# Patient Record
Sex: Female | Born: 2000 | Race: White | Hispanic: No | Marital: Single | Smoking: Never smoker
Health system: Southern US, Community
[De-identification: ages and names within clinical notes are randomized; demographics above are authoritative.]

## PROBLEM LIST (undated history)

## (undated) HISTORY — PX: WISDOM TOOTH EXTRACTION: SHX21

---
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
## 2022-01-04 ENCOUNTER — Encounter: Payer: Self-pay | Admitting: Emergency Medicine

## 2022-01-04 ENCOUNTER — Ambulatory Visit
Admission: EM | Admit: 2022-01-04 | Discharge: 2022-01-04 | Disposition: A | Discharge status: Home / Self Care | Payer: BC Managed Care – PPO | Attending: Physician Assistant | Admitting: Physician Assistant

## 2022-01-04 DIAGNOSIS — S61211A Laceration without foreign body of left index finger without damage to nail, initial encounter: Secondary | ICD-10-CM

## 2022-01-04 NOTE — Discharge Instructions (Signed)
-  Do not get the skin adhesive wet or it can interfere with the integrity and cause breakdown quicker.  Should come off on its own in 5 to 7 days. - I would keep a Band-Aid over it especially while you are working. - Return if you develop increased swelling, redness or increased pain or any signs of infection.  This will probably take about a week to heal.

## 2022-01-04 NOTE — ED Triage Notes (Addendum)
Pt has laceration on her left index finger.  She cut her finger with hair shears while cutting. Occurred yesterday. Pt states she is UTD on her tetanus.

## 2022-01-04 NOTE — ED Provider Notes (Signed)
[facility]    CSN: [id number] Arrival date & time: 01/04/22  1102      History   Chief Complaint Chief Complaint  Patient presents with   Laceration    HPI Sherry Franco is a 21 y.o. female presenting for laceration of the left index finger that occurred yesterday while cutting hair. She has cleaned the finger but says it is still bleeding at times. Up to date with Tdap.  No reports of weakness or difficulty with range of motion of the finger.  HPI  History reviewed. No pertinent past medical history.  There are no problems to display for this patient.   Past Surgical History:  Procedure Laterality Date   WISDOM TOOTH EXTRACTION      OB History   No obstetric history on file.      Home Medications    Prior to Admission medications   Medication Sig Start Date End Date Taking? Authorizing Provider  meloxicam (MOBIC) 7.5 MG tablet Take 7.5 mg by mouth daily.    [provider]    Family History No family history on file.  Social History Social History   Tobacco Use   Smoking status: Never   Smokeless tobacco: Never  Vaping Use   Vaping Use: Never used  Substance Use Topics   Alcohol use: Never   Drug use: Never     Allergies   Patient has no known allergies.   Review of Systems Review of Systems  Musculoskeletal:  Negative for arthralgias and joint swelling.  Skin:  Positive for wound. Negative for color change.  Neurological:  Negative for weakness and numbness.     Physical Exam Triage Vital Signs ED Triage Vitals  Enc Vitals Group     BP 01/04/22 1158 123/68     Pulse Rate 01/04/22 1158 79     Resp 01/04/22 1158 16     Temp 01/04/22 1158 98.3 F (36.8 C)     Temp Source 01/04/22 1158 Oral     SpO2 01/04/22 1158 96 %     Weight 01/04/22 1156 138 lb (62.6 kg)     Height 01/04/22 1156 5\' 2"  (1.575 m)     Head Circumference --      Peak Flow --      Pain Score 01/04/22 1154 4     Pain Loc --      Pain Edu?  --      Excl. in GC? --    No data found.  Updated Vital Signs BP 123/68 (BP Location: Right Arm)   Pulse 79   Temp 98.3 F (36.8 C) (Oral)   Resp 16   Ht 5\' 2"  (1.575 m)   Wt 138 lb (62.6 kg)   LMP 12/20/2021 (Approximate)   SpO2 96%   BMI 25.24 kg/m    Physical Exam Vitals and nursing note reviewed.  Constitutional:      General: She is not in acute distress.    Appearance: Normal appearance. She is not ill-appearing or toxic-appearing.  HENT:     Head: Normocephalic and atraumatic.  Eyes:     General: No scleral icterus.       Right eye: No discharge.        Left eye: No discharge.     Conjunctiva/sclera: Conjunctivae normal.  Cardiovascular:     Rate and Rhythm: Normal rate.     Pulses: Normal pulses.  Pulmonary:     Effort: Pulmonary effort is normal. No respiratory distress.  Musculoskeletal:     Cervical back: Neck supple.  Skin:    General: Skin is dry.     Comments: 0.7 cm curved/flap laceration of the distal left index finger with minimal bleeding.  Full range of motion of finger.  No contamination.  Sensation intact.  Neurological:     General: No focal deficit present.     Mental Status: She is alert. Mental status is at baseline.     Motor: No weakness.     Gait: Gait normal.  Psychiatric:        Mood and Affect: Mood normal.        Behavior: Behavior normal.        Thought Content: Thought content normal.      UC Treatments / Results  Labs (all labs ordered are listed, but only abnormal results are displayed) Labs Reviewed - No data to display  EKG   Radiology No results found.  Procedures Laceration Repair  Date/Time: 01/04/2022 12:47 PM  Performed by: Shirlee Latch, PA-C Authorized by: Shirlee Latch, PA-C   Consent:    Consent obtained:  Verbal   Consent given by:  Patient and guardian   Risks discussed:  Infection, pain, retained foreign body, poor cosmetic result and poor wound healing Universal protocol:    Patient  identity confirmed:  Verbally with patient Anesthesia:    Anesthesia method:  None Pre-procedure details:    Preparation:  Patient was prepped and draped in usual sterile fashion Exploration:    Hemostasis achieved with:  Direct pressure and tourniquet   Contaminated: no   Treatment:    Area cleansed with:  Saline   Amount of cleaning:  Standard   Irrigation solution:  Sterile saline   Debridement:  None Skin repair:    Repair method:  Tissue adhesive Approximation:    Approximation:  Close Repair type:    Repair type:  Simple Post-procedure details:    Dressing:  Non-adherent dressing   Procedure completion:  Tolerated well, no immediate complications  (including critical care time)  Medications Ordered in UC Medications - No data to display  Initial Impression / Assessment and Plan / UC Course  I have reviewed the triage vital signs and the nursing notes.  Pertinent labs & imaging results that were available during my care of the patient were reviewed by me and considered in my medical decision making (see chart for details).   21 year old female presents for a laceration of the left index finger that occurred yesterday when she was cutting hair.  Area has been cleaned.  She is up-to-date with tetanus.  We have cleaned the wound again with wound cleanser in the urgent care.  Area continues to bleed a little.  I used a finger tourniquet and direct pressure to stop the bleeding and then applied Dermabond after consent obtained from parent.  Covered with nonadherent bandage.  Discussed caring for a wound that has been repaired with tissue adhesive.  Reviewed return precautions.   Final Clinical Impressions(s) / UC Diagnoses   Final diagnoses:  Laceration of left index finger without foreign body without damage to nail, initial encounter     Discharge Instructions      -Do not get the skin adhesive wet or it can interfere with the integrity and cause breakdown quicker.   Should come off on its own in 5 to 7 days. - I would keep a Band-Aid over it especially while you are working. - Return if you develop increased  swelling, redness or increased pain or any signs of infection.  This will probably take about a week to heal.   ED Prescriptions   None    PDMP not reviewed this encounter.   Shirlee Latch, PA-C 01/04/22 1250
